# Patient Record
Sex: Female | Born: 2005 | Race: White | Hispanic: No | Marital: Single | Smoking: Never smoker
Health system: Southern US, Community
[De-identification: ages and names within clinical notes are randomized; demographics above are authoritative.]

## PROBLEM LIST (undated history)

## (undated) DIAGNOSIS — T7840XA Allergy, unspecified, initial encounter: Secondary | ICD-10-CM

## (undated) DIAGNOSIS — N390 Urinary tract infection, site not specified: Secondary | ICD-10-CM

## (undated) HISTORY — DX: Allergy, unspecified, initial encounter: T78.40XA

## (undated) HISTORY — DX: Urinary tract infection, site not specified: N39.0

---
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
## 2024-01-19 DIAGNOSIS — R636 Underweight: Secondary | ICD-10-CM | POA: Diagnosis not present

## 2024-02-16 DIAGNOSIS — R636 Underweight: Secondary | ICD-10-CM | POA: Diagnosis not present

## 2024-04-19 DIAGNOSIS — R636 Underweight: Secondary | ICD-10-CM | POA: Diagnosis not present

## 2024-07-14 DIAGNOSIS — R636 Underweight: Secondary | ICD-10-CM | POA: Diagnosis not present
